# Patient Record
Sex: Female | Born: 1995 | Race: White | Hispanic: No | Marital: Single | Smoking: Never smoker
Health system: Southern US, Community
[De-identification: ages and names within clinical notes are randomized; demographics above are authoritative.]

---
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
## 2015-01-18 ENCOUNTER — Emergency Department: Payer: Self-pay | Admitting: Emergency Medicine

## 2015-05-22 ENCOUNTER — Emergency Department: Payer: Self-pay

## 2015-05-22 ENCOUNTER — Emergency Department
Admission: EM | Admit: 2015-05-22 | Discharge: 2015-05-22 | Disposition: A | Discharge status: Home / Self Care | Payer: Self-pay | Attending: Emergency Medicine | Admitting: Emergency Medicine

## 2015-05-22 ENCOUNTER — Encounter: Payer: Self-pay | Admitting: Emergency Medicine

## 2015-05-22 DIAGNOSIS — N739 Female pelvic inflammatory disease, unspecified: Secondary | ICD-10-CM | POA: Insufficient documentation

## 2015-05-22 DIAGNOSIS — N730 Acute parametritis and pelvic cellulitis: Secondary | ICD-10-CM

## 2015-05-22 DIAGNOSIS — Z3202 Encounter for pregnancy test, result negative: Secondary | ICD-10-CM | POA: Insufficient documentation

## 2015-05-22 DIAGNOSIS — R102 Pelvic and perineal pain: Secondary | ICD-10-CM

## 2015-05-22 DIAGNOSIS — R109 Unspecified abdominal pain: Secondary | ICD-10-CM

## 2015-05-22 DIAGNOSIS — R1110 Vomiting, unspecified: Secondary | ICD-10-CM | POA: Insufficient documentation

## 2015-05-22 LAB — URINALYSIS COMPLETE WITH MICROSCOPIC (ARMC ONLY)
Bacteria, UA: NONE SEEN
Bilirubin Urine: NEGATIVE
GLUCOSE, UA: NEGATIVE mg/dL
Hgb urine dipstick: NEGATIVE
Ketones, ur: NEGATIVE mg/dL
Nitrite: NEGATIVE
Protein, ur: NEGATIVE mg/dL
SPECIFIC GRAVITY, URINE: 1.015 (ref 1.005–1.030)
pH: 5 (ref 5.0–8.0)

## 2015-05-22 LAB — CBC WITH DIFFERENTIAL/PLATELET
Basophils Absolute: 0 10*3/uL (ref 0–0.1)
Basophils Relative: 0 %
Eosinophils Absolute: 0.2 10*3/uL (ref 0–0.7)
Eosinophils Relative: 1 %
HEMATOCRIT: 44.6 % (ref 35.0–47.0)
Hemoglobin: 14.3 g/dL (ref 12.0–16.0)
LYMPHS ABS: 2.9 10*3/uL (ref 1.0–3.6)
LYMPHS PCT: 16 %
MCH: 27.5 pg (ref 26.0–34.0)
MCHC: 32.1 g/dL (ref 32.0–36.0)
MCV: 85.5 fL (ref 80.0–100.0)
MONOS PCT: 7 %
Monocytes Absolute: 1.2 10*3/uL — ABNORMAL HIGH (ref 0.2–0.9)
NEUTROS ABS: 13.2 10*3/uL — AB (ref 1.4–6.5)
Neutrophils Relative %: 76 %
Platelets: 335 10*3/uL (ref 150–440)
RBC: 5.21 MIL/uL — ABNORMAL HIGH (ref 3.80–5.20)
RDW: 13.8 % (ref 11.5–14.5)
WBC: 17.5 10*3/uL — ABNORMAL HIGH (ref 3.6–11.0)

## 2015-05-22 LAB — CHLAMYDIA/NGC RT PCR (ARMC ONLY)
Chlamydia Tr: NOT DETECTED
N gonorrhoeae: NOT DETECTED

## 2015-05-22 LAB — COMPREHENSIVE METABOLIC PANEL WITH GFR

## 2015-05-22 LAB — WET PREP, GENITAL
Clue Cells Wet Prep HPF POC: NONE SEEN
Trich, Wet Prep: NONE SEEN
Yeast Wet Prep HPF POC: NONE SEEN

## 2015-05-22 LAB — POCT PREGNANCY, URINE: Preg Test, Ur: NEGATIVE

## 2015-05-22 MED ORDER — IOHEXOL 240 MG/ML SOLN
25.0000 mL | Freq: Once | INTRAMUSCULAR | Status: AC | PRN
  Administered 2015-05-22: 50 mL via INTRAVENOUS

## 2015-05-22 MED ORDER — DOXYCYCLINE HYCLATE 100 MG PO TABS: 100.0000 mg | ORAL_TABLET | Freq: Two times a day (BID) | ORAL | Status: AC

## 2015-05-22 MED ORDER — DOXYCYCLINE HYCLATE 100 MG PO TABS
100.0000 mg | ORAL_TABLET | Freq: Once | ORAL | Status: AC
  Administered 2015-05-22: 100 mg via ORAL

## 2015-05-22 MED ORDER — ONDANSETRON HCL 4 MG/2ML IJ SOLN
INTRAMUSCULAR | Status: AC
Start: 2015-05-22 — End: 2015-05-22
  Filled 2015-05-22: qty 2

## 2015-05-22 MED ORDER — CEFTRIAXONE SODIUM 250 MG IJ SOLR
INTRAMUSCULAR | Status: AC
  Filled 2015-05-22: qty 250

## 2015-05-22 MED ORDER — HYDROCODONE-ACETAMINOPHEN 5-325 MG PO TABS: 1.0000 | ORAL_TABLET | ORAL | Status: AC | PRN

## 2015-05-22 MED ORDER — DOXYCYCLINE HYCLATE 100 MG PO TABS
ORAL_TABLET | ORAL | Status: AC
Start: 2015-05-22 — End: 2015-05-22
  Filled 2015-05-22: qty 1

## 2015-05-22 MED ORDER — CEFTRIAXONE SODIUM 250 MG IJ SOLR
250.0000 mg | Freq: Once | INTRAMUSCULAR | Status: AC
  Administered 2015-05-22: 250 mg via INTRAMUSCULAR

## 2015-05-22 MED ORDER — ONDANSETRON HCL 4 MG/2ML IJ SOLN
4.0000 mg | Freq: Once | INTRAMUSCULAR | Status: AC
  Administered 2015-05-22: 4 mg via INTRAVENOUS

## 2015-05-22 MED ORDER — IOHEXOL 350 MG/ML SOLN
75.0000 mL | Freq: Once | INTRAVENOUS | Status: AC | PRN
  Administered 2015-05-22: 75 mL via INTRAVENOUS

## 2015-05-22 MED ORDER — SODIUM CHLORIDE 0.9 % IV SOLN
Freq: Once | INTRAVENOUS | Status: AC
  Administered 2015-05-22: 13:00:00 via INTRAVENOUS

## 2015-05-22 NOTE — ED Notes (Signed)
MD Kinner at bedside  

## 2015-05-22 NOTE — ED Notes (Signed)
Pt back from US at this time.

## 2015-05-22 NOTE — Discharge Instructions (Signed)
Pelvic Inflammatory Disease °Pelvic inflammatory disease (PID) is an infection in some or all of the female organs. PID can be in the uterus, ovaries, fallopian tubes, or the surrounding tissues inside the lower belly area (pelvis). °HOME CARE  °· If given, take your antibiotic medicine as told. Finish them even if you start to feel better. °· Only take medicine as told by your doctor. °· Do not have sex (intercourse) until treatment is done or as told by your doctor. °· Tell your sex partner if you have PID. Your partner may need to be treated. °· Keep all doctor visits. °GET HELP RIGHT AWAY IF:  °· You have a fever. °· You have more belly (abdominal) or lower belly pain. °· You have chills. °· You have pain when you pee (urinate). °· You are not better after 72 hours. °· You have more fluid (discharge) coming from your vagina or fluid that is not normal. °· You need pain medicine from your doctor. °· You throw up (vomit). °· You cannot take your medicines. °· Your partner has a sexually transmitted disease (STD). °MAKE SURE YOU:  °· Understand these instructions. °· Will watch your condition. °· Will get help right away if you are not doing well or get worse. °Document Released: 02/11/2009 Document Revised: 03/12/2013 Document Reviewed: 11/11/2011 °[employer]® Patient Information ©2015 [employer]. This information is not intended to replace advice given to you by your health care provider. Make sure you discuss any questions you have with your health care provider. ° °

## 2015-05-22 NOTE — ED Notes (Signed)
C/o RLQ abd pain x 4 days, started with n,v last night, states pain is getting worse, pt vomiting in triage

## 2015-05-22 NOTE — ED Provider Notes (Signed)
[facility] Emergency Department Provider Note  ____________________________________________  Time seen: 12:20 PM  I have reviewed the triage vital signs and the nursing notes.   HISTORY  Chief Complaint Abdominal Pain      HPI Carla Palmer is a 19 y.o. female who presents with right lower quadrant pain which has steadily become worse over the last 2 days. He reports the pain is sharp and was on 9 today but is better after her father gave her tramadol. She reports nausea and vomiting. She has never had this before. She has no vaginal discharge. She has no dysuria. She does report chills but has not checked her temperature. She has never had any surgeries     No past medical history on file.  There are no active problems to display for this patient.   No past surgical history on file.  No current outpatient prescriptions on file.  Allergies Latex  No family history on file.  Social History History  Substance Use Topics  . Smoking status: Never Smoker   . Smokeless tobacco: Not on file  . Alcohol Use: No    Review of Systems  Constitutional: Negative for fever. Positive for chills Eyes: Negative for visual changes. ENT: Negative for sore throat Cardiovascular: Negative for chest pain. Respiratory: Negative for shortness of breath. Gastrointestinal: Positive for abdominal pain and vomiting Genitourinary: Negative for dysuria. Negative for vaginal discharge Musculoskeletal: Negative for back pain. Skin: Negative for rash. Neurological: Negative for headaches or focal weakness Psychiatric: No anxiety  10-point ROS otherwise negative.  ____________________________________________   PHYSICAL EXAM:  VITAL SIGNS: ED Triage Vitals  Enc Vitals Group     BP 05/22/15 0933 146/85 mmHg     Pulse Rate 05/22/15 0933 152     Resp 05/22/15 1228 12     Temp 05/22/15 0933 98.3 F (36.8 C)     Temp Source 05/22/15 0933 Oral      SpO2 05/22/15 0933 100 %     Weight 05/22/15 0933 135 lb (61.236 kg)     Height 05/22/15 0933  (1.626 m)     Head Cir --      Peak Flow --      Pain Score --      Pain Loc --      Pain Edu? --      Excl. in GC? --      Constitutional: Alert and oriented. Well appearing and uncomfortable Eyes: Conjunctivae are normal.  ENT   Head: Normocephalic and atraumatic.   Mouth/Throat: Mucous membranes are moist. Cardiovascular: Cardiac, heart rate 128 on exam, regular rhythm. Normal and symmetric distal pulses are present in all extremities. No murmurs, rubs, or gallops. Respiratory: Normal respiratory effort without tachypnea nor retractions. Breath sounds are clear and equal bilaterally.  Gastrointestinal: Tenderness to palpation right lower quadrant, McBurney's point tenderness . No distention.  no evidence of peritonitis .There is no CVA tenderness. Genitourinary: deferred Musculoskeletal: Nontender with normal range of motion in all extremities. No lower extremity tenderness nor edema. Neurologic:  Normal speech and language. No gross focal neurologic deficits are appreciated. Skin:  Skin is warm, dry and intact. No rash noted. Psychiatric: Mood and affect are normal. Patient exhibits appropriate insight and judgment.  ____________________________________________    LABS (pertinent positives/negatives)  Labs Reviewed  CBC WITH DIFFERENTIAL/PLATELET - Abnormal; Notable for the following:    WBC 17.5 (*)    RBC 5.21 (*)    Neutro Abs 13.2 (*)  Monocytes Absolute 1.2 (*)    All other components within normal limits  URINALYSIS COMPLETEWITH MICROSCOPIC ([facility] ONLY) - Abnormal; Notable for the following:    Color, Urine YELLOW (*)    APPearance HAZY (*)    Leukocytes, UA TRACE (*)    Squamous Epithelial / LPF 6-30 (*)    All other components within normal limits  COMPREHENSIVE METABOLIC PANEL  COMPREHENSIVE METABOLIC PANEL  POCT PREGNANCY, URINE     ____________________________________________   EKG  None  ____________________________________________    RADIOLOGY  CT scan shows unremarkable appendix but does show left sided ovarian complex cyst, ultrasound recommended.  ____________________________________________   PROCEDURES  Procedure(s) performed: none  Critical Care performed: none  ____________________________________________   INITIAL IMPRESSION / ASSESSMENT AND PLAN / ED COURSE  Pertinent labs & imaging results that were available during my care of the patient were reviewed by me and considered in my medical decision making (see chart for details).   Initial impression significant concerning for appendicitis, given gradually worsening right lower quadrant pain, tachycardia and elevated white blood cell count. Patient  does not want pain medications at this time, she has received 2 doses of nausea medication and reports her nausea is better. We'll give IV fluid bolus and send her for CT abdomen pelvis.  ----------------------------------------- 4:34 PM on 05/22/2015 -----------------------------------------  CT shows left-sided complex ovarian cyst. On pelvic exam patient with white discharge and mild cervicitis. Pending ultrasound  ----------------------------------------- 5:44 PM on 05/22/2015 -----------------------------------------  Ultrasound shows left hemorrhagic cyst. Wet prep shows many white blood cells. I believe the cause of the patient's pain is PID, possibly that she can be treated as an outpatient given that she is relatively comfortable is not requiring further IV pain medication.  We will give Rocephin 250 IM in the emergency department and discharged with doxycycline  ____________________________________________   FINAL CLINICAL IMPRESSION(S) / ED DIAGNOSES  Final diagnoses:  Abdominal pain  PID (acute pelvic inflammatory disease)     Jene Every, MD 05/22/15 1755

## 2015-05-22 NOTE — ED Notes (Signed)
Pt returned to room at this time, ambulated to toilet with no concerns.

## 2015-05-22 NOTE — ED Notes (Signed)
Pt placed on medhold until 1830, pt and family made aware, verbalized understanding. Pt verbalized no further needs at this time.

## 2016-04-03 IMAGING — CT CT ABD-PELV W/ CM
1 of 2 series · 15 of 32 positions shown, 19 images · IV contrast (omnipaque)
Comparison: None.

CLINICAL DATA: 19-year-old female nausea and vomiting x2 days

EXAM:
CT ABDOMEN AND PELVIS WITH CONTRAST
TECHNIQUE: Multidetector CT imaging of the abdomen and pelvis was performed
using the standard protocol following bolus administration of
intravenous contrast.
CONTRAST:  75mL OMNIPAQUE IOHEXOL 350 MG/ML SOLN

[Series 2: routine abd pel with · axial · 0.63mm/px · z∈[-344,+91]mm · 15 of 95 slices shown, 19 images]
[im 4/95  soft-tissue]
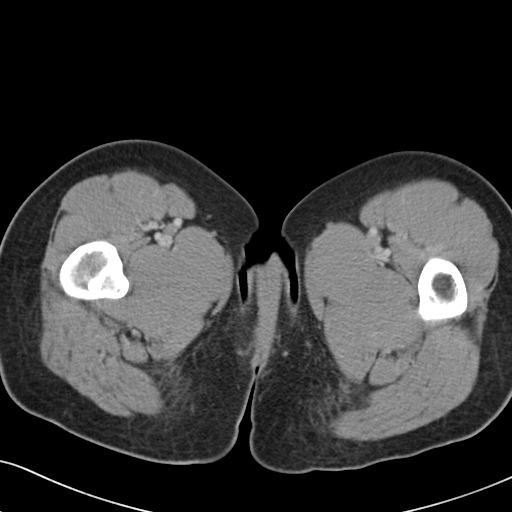
[im 4/95  bone]
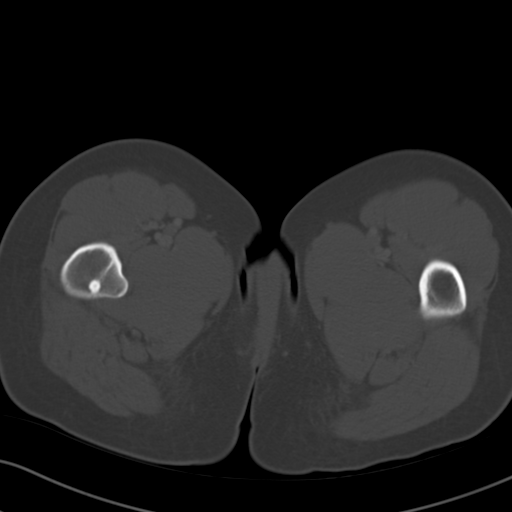
[im 11/95  soft-tissue]
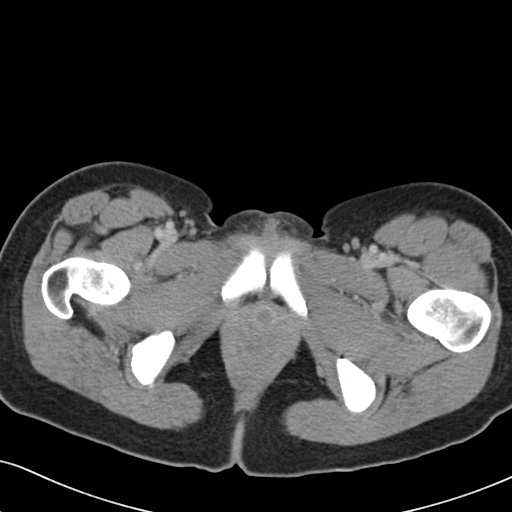
[im 19/95  soft-tissue]
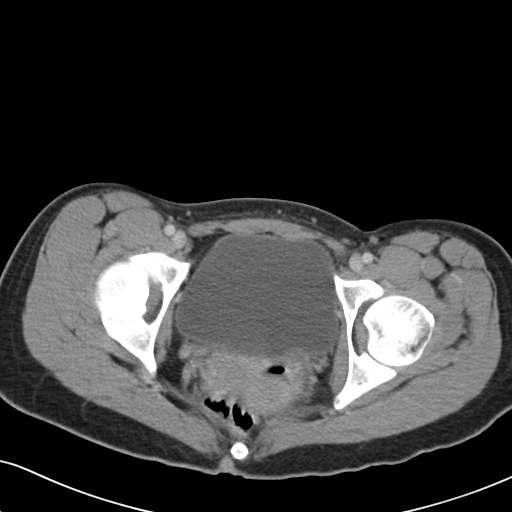
[im 26/95  soft-tissue]
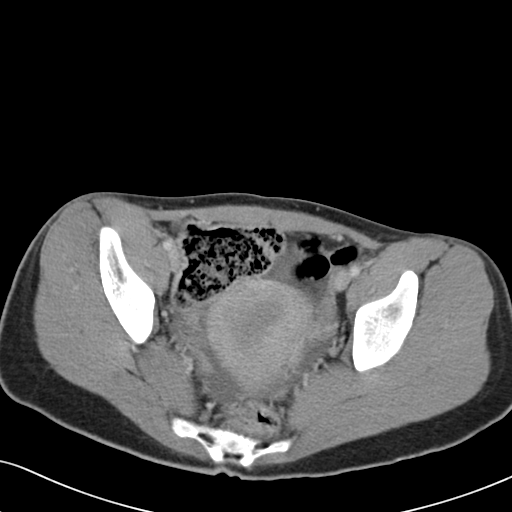
[im 33/95  soft-tissue]
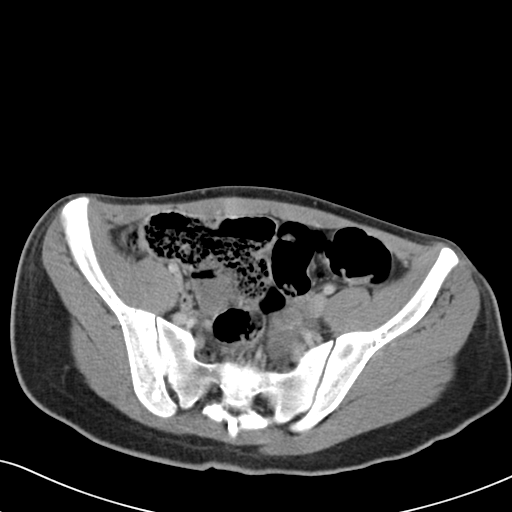
[im 40/95  soft-tissue]
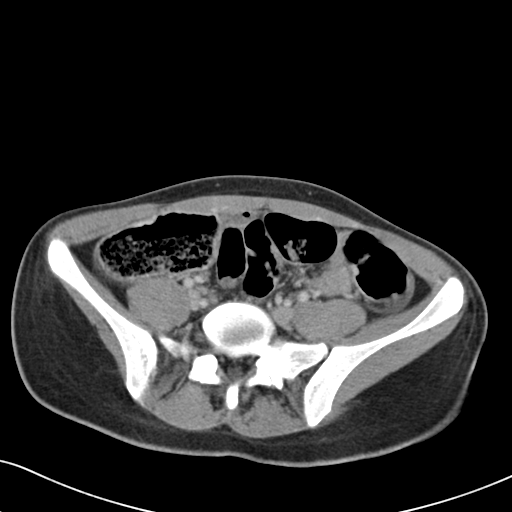
[im 48/95  soft-tissue]
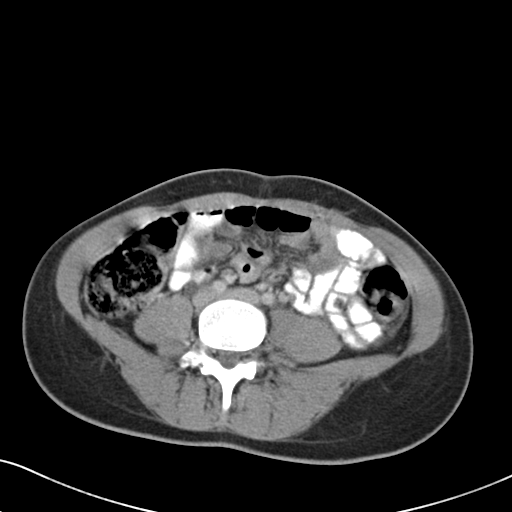
[im 55/95  soft-tissue]
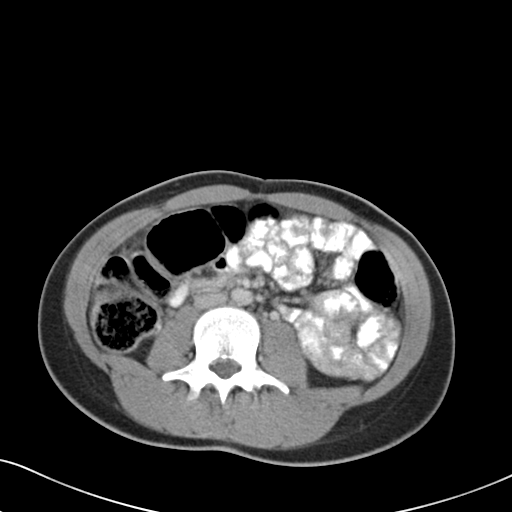
[im 62/95  soft-tissue]
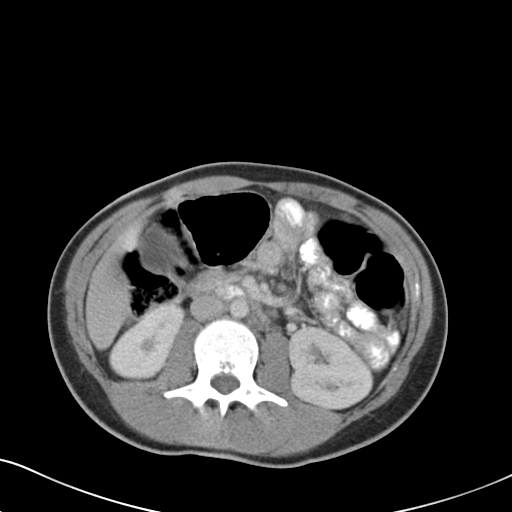
[im 62/95  bone]
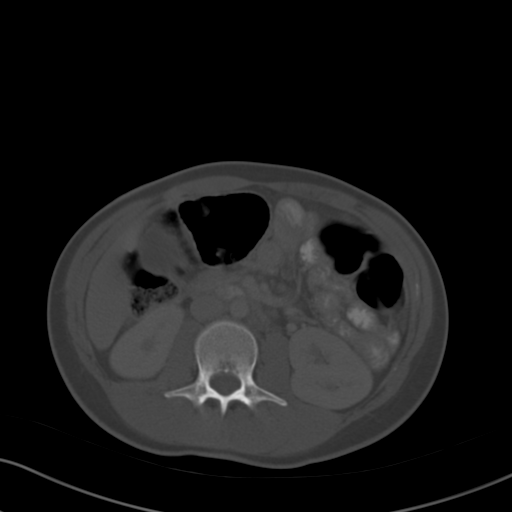
[im 69/95  soft-tissue]
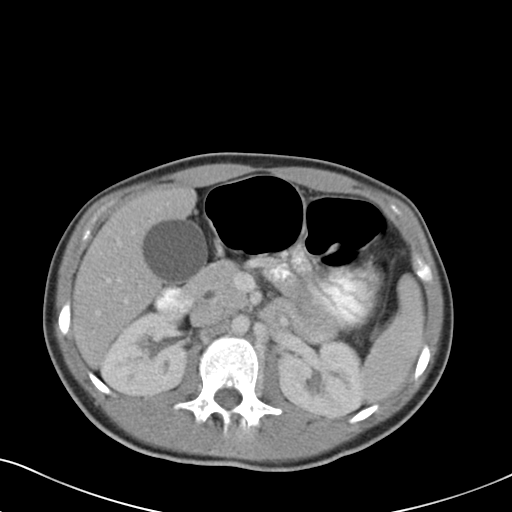
[im 76/95  soft-tissue]
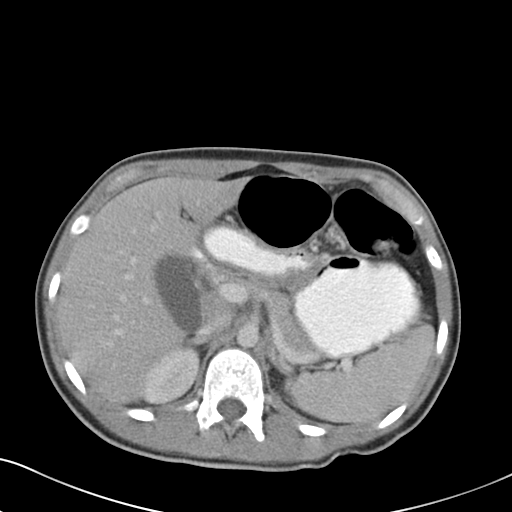
[im 80/95  lung]
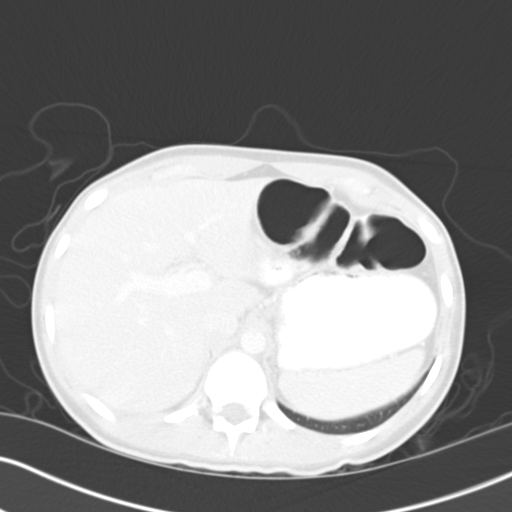
[im 84/95  soft-tissue]
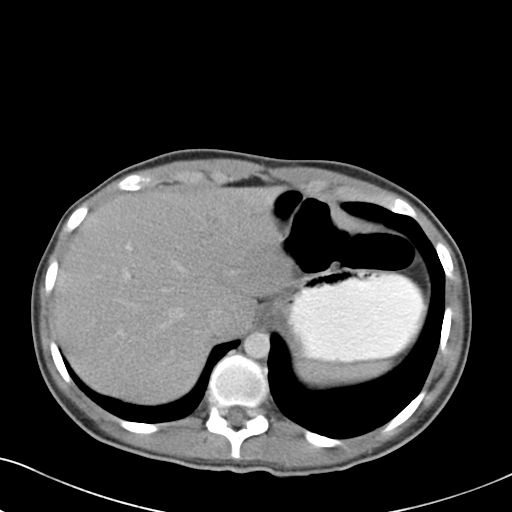
[im 84/95  lung]
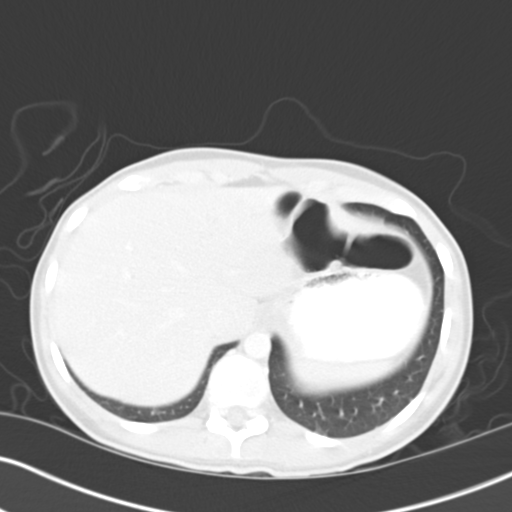
[im 87/95  lung]
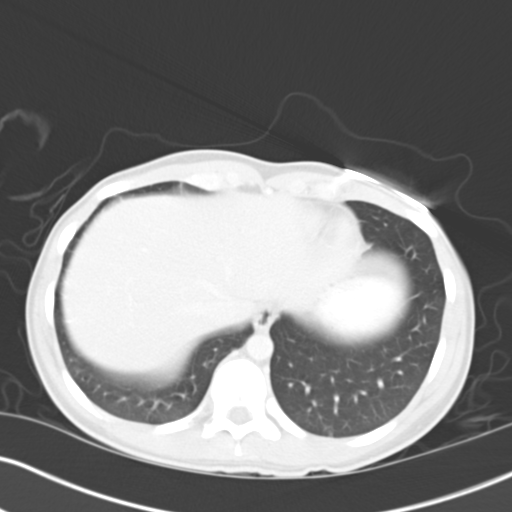
[im 91/95  soft-tissue]
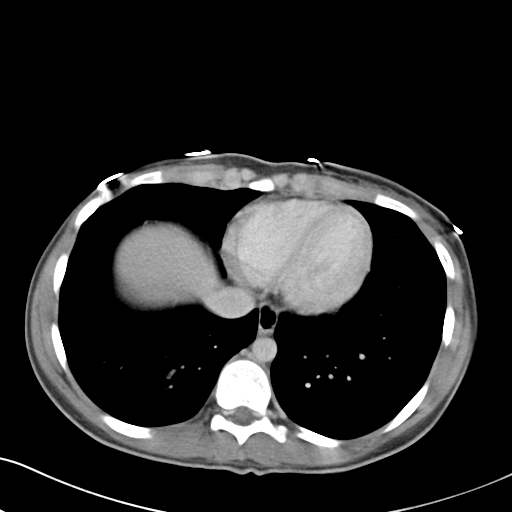
[im 91/95  lung]
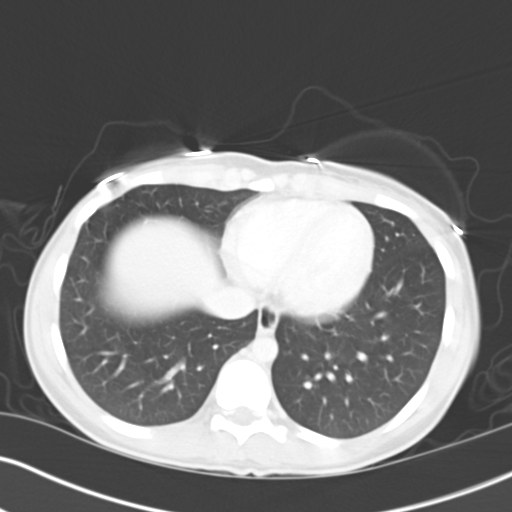

[15 of 32 positions shown; findings below may reference images not displayed]

FINDINGS: Lower chest:  Unremarkable.

Peritoneum: Small free fluid within the pelvis.  No free air.

Liver: Unremarkable.No intrahepatic biliary ductal dilatation.

Gallbladder: Unremarkable.

Pancreas: Unremarkable.No ductal dilatation.

Spleen: Unremarkable.

Adrenals glands: Unremarkable.

Kidneys, ureters, urinary bladder: There is mild bilateral
hydronephrosis. No definite stone identified. Findings may be
related to decreased ureteral peristalsis. Correlation with
urinalysis recommended to exclude UTI. The urinary bladder is
grossly unremarkable.

Reproductive: A 3 x 4 cm cyst and a 1.2 x 2.0 cm complex cystic
structure are noted in the left ovary. A small possibly ruptured
corpus luteum may be present in the right ovary. Ultrasound is
recommended for better evaluation of the pelvic structures.

Bowel and appendix: There is apparent thickening of the distal
stomach, likely related to underdistention. Gastritis is less
likely. Clinical correlation is recommended. No bowel
obstruction.The appendix is unremarkable.

Vascular/Lymphatic: Unremarkable.No lymphadenopathy.

Musculoskeletal: Small right femoral bone island. The osseous
structures are otherwise unremarkable.
IMPRESSION: Left ovarian cysts as described. Ultrasound is recommended for
better evaluation of the pelvic structures.

Mild bilateral hydronephrosis.

No evidence of bowel obstruction.  Normal appendix.
